# Patient Record
Sex: Male | Born: 1960 | Race: Black or African American | Hispanic: No | Marital: Married | Smoking: Never smoker
Health system: Southern US, Community
[De-identification: ages and names within clinical notes are randomized; demographics above are authoritative.]

---

## 2015-03-02 ENCOUNTER — Observation Stay (HOSPITAL_COMMUNITY)
Admission: EM | Admit: 2015-03-02 | Discharge: 2015-03-04 | Disposition: A | Discharge status: Home / Self Care | Payer: BC Managed Care – PPO | Attending: Internal Medicine | Admitting: Internal Medicine

## 2015-03-02 ENCOUNTER — Emergency Department (HOSPITAL_COMMUNITY): Payer: BC Managed Care – PPO

## 2015-03-02 DIAGNOSIS — R569 Unspecified convulsions: Secondary | ICD-10-CM | POA: Diagnosis not present

## 2015-03-02 DIAGNOSIS — R531 Weakness: Secondary | ICD-10-CM | POA: Diagnosis not present

## 2015-03-02 DIAGNOSIS — M542 Cervicalgia: Secondary | ICD-10-CM

## 2015-03-02 DIAGNOSIS — Z87820 Personal history of traumatic brain injury: Secondary | ICD-10-CM | POA: Diagnosis not present

## 2015-03-02 LAB — COMPREHENSIVE METABOLIC PANEL
ALK PHOS: 77 U/L (ref 38–126)
ALT: 23 U/L (ref 17–63)
ANION GAP: 17 — AB (ref 5–15)
AST: 39 U/L (ref 15–41)
Albumin: 3.6 g/dL (ref 3.5–5.0)
BILIRUBIN TOTAL: 0.8 mg/dL (ref 0.3–1.2)
BUN: 14 mg/dL (ref 6–20)
CHLORIDE: 106 mmol/L (ref 101–111)
CO2: 17 mmol/L — ABNORMAL LOW (ref 22–32)
Calcium: 8.9 mg/dL (ref 8.9–10.3)
Creatinine, Ser: 1.61 mg/dL — ABNORMAL HIGH (ref 0.61–1.24)
GFR calc non Af Amer: 46 mL/min — ABNORMAL LOW (ref >60)
GFR, EST AFRICAN AMERICAN: 54 mL/min — AB (ref >60)
Glucose, Bld: 158 mg/dL — ABNORMAL HIGH (ref 65–99)
Potassium: 4.8 mmol/L (ref 3.5–5.1)
SODIUM: 140 mmol/L (ref 135–145)
TOTAL PROTEIN: 6.5 g/dL (ref 6.5–8.1)

## 2015-03-02 LAB — CBC
HCT: 41.7 % (ref 39.0–52.0)
HEMOGLOBIN: 13.7 g/dL (ref 13.0–17.0)
MCH: 26.6 pg (ref 26.0–34.0)
MCHC: 32.9 g/dL (ref 30.0–36.0)
MCV: 80.8 fL (ref 78.0–100.0)
Platelets: 204 10*3/uL (ref 150–400)
RBC: 5.16 MIL/uL (ref 4.22–5.81)
RDW: 13.6 % (ref 11.5–15.5)
WBC: 5.1 10*3/uL (ref 4.0–10.5)

## 2015-03-02 LAB — RAPID URINE DRUG SCREEN, HOSP PERFORMED
AMPHETAMINES: NOT DETECTED
BENZODIAZEPINES: POSITIVE — AB
Barbiturates: NOT DETECTED
COCAINE: NOT DETECTED
OPIATES: NOT DETECTED
Tetrahydrocannabinol: NOT DETECTED

## 2015-03-02 LAB — MAGNESIUM: Magnesium: 2.3 mg/dL (ref 1.7–2.4)

## 2015-03-02 LAB — CBG MONITORING, ED: GLUCOSE-CAPILLARY: 162 mg/dL — AB (ref 65–99)

## 2015-03-02 MED ORDER — LORAZEPAM 2 MG/ML IJ SOLN
1.0000 mg | Freq: Once | INTRAMUSCULAR | Status: DC
  Filled 2015-03-02: qty 1

## 2015-03-02 MED ORDER — GADOBENATE DIMEGLUMINE 529 MG/ML IV SOLN
20.0000 mL | Freq: Once | INTRAVENOUS | Status: AC | PRN
  Administered 2015-03-02: 18 mL via INTRAVENOUS
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
## 2015-03-02 NOTE — ED Notes (Signed)
Pt still mri

## 2015-03-02 NOTE — ED Notes (Signed)
No complaints  alert

## 2015-03-02 NOTE — ED Notes (Signed)
Pt in from friend's home while visiting, per [facility] EMS pt had witnessed unresponsive episode lasting 1 min with L gaze prior to episode, pt noted to have bil extremity shaking during episode, pt rcvd 5 mg IM Versed pta, pt reported to [facility] @ 17:20, pt reported to have tongue injury present, neg for urine incontinence, pt reported to have size 3 pupils non reactive, pt airway intact upon arrival, pt non verbal since episode, pt noted to be combative

## 2015-03-02 NOTE — Consult Note (Signed)
Admission H&P    Chief Complaint: Witnessed generalized seizure.  HPI: Dustin Jarvis is an 55 y.o. male was brought to the emergency room following a witnessed generalized seizure at home. Patient has no history of seizure activity. He was postictal and combative afterwards. Mental status subsequently returned to normal after arriving in the emergency room. He and his spouse are not aware of any recent illnesses. CT scan of his head showed no acute intracranial abnormality. Glucose was 168. Electrolytes were normal, including sodium, calcium and magnesium. Urine drug screen results are pending. There is no history of alcohol abuse.  Past medical history: Scoliosis Neck pain  No past surgical history on file.  Family history: Obtained and was noncontributory.  Social History:  has no tobacco, alcohol, and drug history on file.  Allergies: No Known Allergies  Medications: Patient's preadmission medications were reviewed by me.  ROS: History obtained from spouse and the patient  General ROS: negative for - chills, fatigue, fever, night sweats, weight gain or weight loss Psychological ROS: negative for - behavioral disorder, hallucinations, memory difficulties, mood swings or suicidal ideation Ophthalmic ROS: negative for - blurry vision, double vision, eye pain or loss of vision ENT ROS: negative for - epistaxis, nasal discharge, oral lesions, sore throat, tinnitus or vertigo Allergy and Immunology ROS: negative for - hives or itchy/watery eyes Hematological and Lymphatic ROS: negative for - bleeding problems, bruising or swollen lymph nodes Endocrine ROS: negative for - galactorrhea, hair pattern changes, polydipsia/polyuria or temperature intolerance Respiratory ROS: negative for - cough, hemoptysis, shortness of breath or wheezing Cardiovascular ROS: negative for - chest pain, dyspnea on exertion, edema or irregular heartbeat Gastrointestinal ROS: negative for - abdominal pain,  diarrhea, hematemesis, nausea/vomiting or stool incontinence Genito-Urinary ROS: negative for - dysuria, hematuria, incontinence or urinary frequency/urgency Musculoskeletal ROS: negative for - joint swelling or muscular weakness Neurological ROS: as noted in HPI Dermatological ROS: negative for rash and skin lesion changes  Physical Examination: Blood pressure 123/77, pulse 90, resp. rate 16, SpO2 100 %.  HEENT-  Normocephalic, no lesions, without obvious abnormality.  Normal external eye and conjunctiva.  Normal TM's bilaterally.  Normal auditory canals and external ears. Normal external nose, mucus membranes and septum.  Normal pharynx. Neck supple with no masses, nodes, nodules or enlargement. Cardiovascular - regular rate and rhythm, S1, S2 normal, no murmur, click, rub or gallop Lungs - chest clear, no wheezing, rales, normal symmetric air entry Abdomen - soft, non-tender; bowel sounds normal; no masses,  no organomegaly Extremities - no joint deformities, effusion, or inflammation and no edema  Neurologic Examination: Mental Status: Alert, oriented, thought content appropriate.  Speech fluent without evidence of aphasia. Able to follow commands without difficulty. Cranial Nerves: II-Visual fields were normal. III/IV/VI-Pupils were equal and reacted normally to light. Extraocular movements were full and conjugate.    V/VII-no facial numbness and no facial weakness. VIII-normal. X-normal speech and symmetrical palatal movement. XI: trapezius strength/neck flexion strength normal bilaterally XII-midline tongue extension with normal strength. Motor: 5/5 bilaterally with normal tone and bulk Sensory: Normal throughout. Deep Tendon Reflexes: 2+ and symmetric, except absent in right lower extremity. Plantars: Flexor bilaterally Cerebellar: Normal finger-to-nose testing. Carotid auscultation: Normal  Results for orders placed or performed during the hospital encounter of 03/02/15  (from the past 48 hour(s))  CBG monitoring, ED     Status: Abnormal   Collection Time: 03/02/15  5:45 PM  Result Value Ref Range   Glucose-Capillary 162 (H) 65 - 99 mg/dL  Comment 1 Notify RN    Comment 2 Document in Chart   CBC - if new onset seizures     Status: None   Collection Time: 03/02/15  5:50 PM  Result Value Ref Range   WBC 5.1 4.0 - 10.5 K/uL   RBC 5.16 4.22 - 5.81 MIL/uL   Hemoglobin 13.7 13.0 - 17.0 g/dL   HCT 41.7 39.0 - 52.0 %   MCV 80.8 78.0 - 100.0 fL   MCH 26.6 26.0 - 34.0 pg   MCHC 32.9 30.0 - 36.0 g/dL   RDW 13.6 11.5 - 15.5 %   Platelets 204 150 - 400 K/uL  Comprehensive metabolic panel     Status: Abnormal   Collection Time: 03/02/15  5:50 PM  Result Value Ref Range   Sodium 140 135 - 145 mmol/L   Potassium 4.8 3.5 - 5.1 mmol/L   Chloride 106 101 - 111 mmol/L   CO2 17 (L) 22 - 32 mmol/L   Glucose, Bld 158 (H) 65 - 99 mg/dL   BUN 14 6 - 20 mg/dL   Creatinine, Ser 1.61 (H) 0.61 - 1.24 mg/dL   Calcium 8.9 8.9 - 10.3 mg/dL   Total Protein 6.5 6.5 - 8.1 g/dL   Albumin 3.6 3.5 - 5.0 g/dL   AST 39 15 - 41 U/L   ALT 23 17 - 63 U/L   Alkaline Phosphatase 77 38 - 126 U/L   Total Bilirubin 0.8 0.3 - 1.2 mg/dL   GFR calc non Af Amer 46 (L) >60 mL/min   GFR calc Af Amer 54 (L) >60 mL/min    Comment: (NOTE) The eGFR has been calculated using the CKD EPI equation. This calculation has not been validated in all clinical situations. eGFR's persistently <60 mL/min signify possible Chronic Kidney Disease.    Anion gap 17 (H) 5 - 15  Magnesium     Status: None   Collection Time: 03/02/15  5:50 PM  Result Value Ref Range   Magnesium 2.3 1.7 - 2.4 mg/dL   Ct Head Wo Contrast  03/02/2015  CLINICAL DATA:  New on set seizure this afternoon EXAM: CT HEAD WITHOUT CONTRAST TECHNIQUE: Contiguous axial images were obtained from the base of the skull through the vertex without intravenous contrast. COMPARISON:  None. FINDINGS: There is no intra or extra-axial fluid  collection or mass lesion. The basilar cisterns and ventricles have a normal appearance. There is no CT evidence for acute infarction or hemorrhage. No calvarial injury. Mild mucoperiosteal thickening within the left maxillary sinus. IMPRESSION: 1.  No evidence for acute intracranial abnormality. 2. Mild chronic sinusitis. Electronically Signed   By: Nolon Nations M.D.   On: 03/02/2015 17:57    Assessment/Plan 55 year old man presenting following a generalized tonic-clonic seizure, with no history of any seizure activity. Clinical examination and CT scan of his head was unremarkable. Laboratory studies were unremarkable. Urine drug screen is pending, however.  Recommendations: 1. MRI of the brain without and with contrast 2. EEG, routine adult study 3. No anticonvulsant medication indicated at this point  We will continue to follow this patient with you  C.R. Nicole Kindred, [place] [facility] [phone]  03/02/2015, 8:48 PM

## 2015-03-02 NOTE — ED Notes (Signed)
Patient undressed, in gown, on monitor, continuous pulse oximetry, blood pressure cuff and oxygen [place] (2L) 

## 2015-03-02 NOTE — ED Notes (Signed)
The pt has no pain  He is c/o being very hungry

## 2015-03-02 NOTE — ED Notes (Signed)
Pt to mri 

## 2015-03-02 NOTE — ED Provider Notes (Signed)
CSN: [id number]     Arrival date & time 03/02/15  1712 History   First MD Initiated Contact with Patient 03/02/15 1725     Chief Complaint  Patient presents with  . Seizures     (Consider location/radiation/quality/duration/timing/severity/associated sxs/prior Treatment) HPI   9 y m w no sig PMH who presents with a first time witnessed seizure.  The episode lasted for 1 minute, patient had L sided gaze and had tonic/clonic movements of bilateral upper extremities..  Patient was given 5mg  versed IM with EMS.    Patient is still confused at this time and unable to offer hx  No past medical history on file. No past surgical history on file. No family history on file. Social History  Substance Use Topics  . Smoking status: Not on file  . Smokeless tobacco: Not on file  . Alcohol Use: Not on file    Review of Systems  Unable to perform ROS: Mental status change  Neurological: Positive for seizures.      Allergies  Review of patient's allergies indicates no known allergies.  Home Medications   Prior to Admission medications   Medication Sig Start Date End Date Taking? Authorizing Provider  cyclobenzaprine (FLEXERIL) 5 MG tablet Take 1.25 mg by mouth daily as needed (back pain). 1/4 tablet   Yes Historical Provider, MD  ibuprofen (ADVIL,MOTRIN) 200 MG tablet Take 200 mg by mouth daily as needed (pain).   Yes Historical Provider, MD  naproxen sodium (ALEVE) 220 MG tablet Take 220 mg by mouth daily as needed (pain).   Yes Historical Provider, MD  OVER THE COUNTER MEDICATION Take 1 tablet by mouth daily as needed (seasonal allergies). Over the counter allergy tablet   Yes Historical Provider, MD  tadalafil (CIALIS) 5 MG tablet Take 5 mg by mouth daily as needed for erectile dysfunction.   Yes Historical Provider, MD   BP 142/83 mmHg  Pulse 73  Temp(Src) 98.4 F (36.9 C) (Oral)  Resp 18  SpO2 97% Physical Exam  Constitutional: No distress.  HENT:  Head: Normocephalic and  atraumatic.  Eyes: EOM are normal. Pupils are equal, round, and reactive to light.  Neck: Normal range of motion. Neck supple.  Cardiovascular: Normal rate.   Pulmonary/Chest: Effort normal. No respiratory distress.  Abdominal: Soft. There is no tenderness.  Musculoskeletal: He exhibits no edema.  Neurological:  Pupils 3mm bl and reactive to light.  Patient localizing pain in bilateral upper extremities.  Skin: No rash noted. He is not diaphoretic.    ED Course  Procedures (including critical care time) Labs Review Labs Reviewed  COMPREHENSIVE METABOLIC PANEL - Abnormal; Notable for the following:    CO2 17 (*)    Glucose, Bld 158 (*)    Creatinine, Ser 1.61 (*)    GFR calc non Af Amer 46 (*)    GFR calc Af Amer 54 (*)    Anion gap 17 (*)    All other components within normal limits  URINE RAPID DRUG SCREEN, HOSP PERFORMED - Abnormal; Notable for the following:    Benzodiazepines POSITIVE (*)    All other components within normal limits  CBG MONITORING, ED - Abnormal; Notable for the following:    Glucose-Capillary 162 (*)    All other components within normal limits  CBC  MAGNESIUM  CBG MONITORING, ED  CBG MONITORING, ED    Imaging Review Ct Head Wo Contrast  03/02/2015  CLINICAL DATA:  New on set seizure this afternoon EXAM: CT HEAD WITHOUT  CONTRAST TECHNIQUE: Contiguous axial images were obtained from the base of the skull through the vertex without intravenous contrast. COMPARISON:  None. FINDINGS: There is no intra or extra-axial fluid collection or mass lesion. The basilar cisterns and ventricles have a normal appearance. There is no CT evidence for acute infarction or hemorrhage. No calvarial injury. Mild mucoperiosteal thickening within the left maxillary sinus. IMPRESSION: 1.  No evidence for acute intracranial abnormality. 2. Mild chronic sinusitis. Electronically Signed   By: Norva Pavlov M.D.   On: 03/02/2015 17:57   Mr Laqueta Jean JX Contrast  03/03/2015  CLINICAL  DATA:  Witnessed general seizure at home today. No history of seizures. EXAM: MRI HEAD WITHOUT AND WITH CONTRAST MRI CERVICAL SPINE WITHOUT AND WITH CONTRAST TECHNIQUE: Multiplanar, multiecho pulse sequences of the brain and surrounding structures, and cervical spine, to include the craniocervical junction and cervicothoracic junction, were obtained without and with intravenous contrast. 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 Cerebellar tonsillars descend 17 mm below the foramen magnum with pointed appearance and effacement of cerebral spinal fluid space. No MR findings of intracranial hypotension. The ventricles and sulci are normal for patient's age. No abnormal parenchymal signal, mass lesions, mass effect. No reduced diffusion to suggest acute ischemia. A few subcentimeter foci of FLAIR T2 hyperintense signal are less than expected for age, most commonly seen with chronic small vessel ischemic disease. No susceptibility artifact to suggest hemorrhage. No abnormal intracranial enhancement. Hippocampi demonstrate symmetric size, morphology and signal. No abnormal extra-axial fluid collections. No extra-axial masses though, contrast enhanced sequences would be more sensitive. Normal major intracranial vascular flow voids seen at the skull base. Ocular globes and orbital contents are unremarkable though not tailored for evaluation. No abnormal sellar expansion. No suspicious calvarial bone marrow signal. Mild dolichocephaly. Small LEFT maxillary mucosal retention cyst without paranasal sinus air-fluid levels. Mastoid air cells are well aerated. MRI CERVICAL SPINE FINDINGS Prominent body habitus results in decreased signal to noise ratio. Cervical vertebral bodies are intact and aligned and maintenance of cervical lordosis. Intervertebral discs demonstrate normal  morphology and signal characteristics. No STIR signal abnormality to suggest acute osseous process. No abnormal osseous or intradiscal enhancement. Prominent central spinal canal is less than 2 mm. No abnormal cord signal or myelomalacia. No abnormal cord, leptomeningeal or epidural enhancement. Included prevertebral and paraspinal soft tissues are normal. Level by level evaluation: C2-3: No disc bulge, canal stenosis nor neural foraminal narrowing. C3-4: Small broad-based disc bulge, uncovertebral hypertrophy. No canal stenosis. Mild RIGHT, moderate LEFT neural foraminal narrowing. C4-5: No disc bulge. Mild facet arthropathy and uncovertebral hypertrophy without canal stenosis. Mild to moderate RIGHT neural foraminal narrowing. C5-6: No disc bulge. Moderate RIGHT, mild LEFT facet arthropathy and uncovertebral hypertrophy without canal stenosis. Mild bilateral neural foraminal narrowing. C6-7 and C7-T1: No disc bulge, canal stenosis or neural foraminal narrowing. IMPRESSION: MRI BRAIN:  No acute intracranial process. Platybasia and Chiari 1 malformation. MRI CERVICAL SPINE:  Prominent central spinal canal, without syrinx. Degenerative change of the cervical spine without canal stenosis. C3-4 through C5-6 neural foraminal narrowing: Moderate on the LEFT at C3-4. Electronically Signed   By: Awilda Metro M.D.   On: 03/03/2015 00:16   Mr Cervical Spine W Wo Contrast  03/03/2015  CLINICAL DATA:  Witnessed general seizure at home today. No history of seizures. EXAM: MRI HEAD WITHOUT AND WITH CONTRAST MRI CERVICAL SPINE  WITHOUT AND WITH CONTRAST TECHNIQUE: Multiplanar, multiecho pulse sequences of the brain and surrounding structures, and cervical spine, to include the craniocervical junction and cervicothoracic junction, were obtained without and with intravenous contrast. CONTRAST:  18mL MULTIHANCE GADOBENATE DIMEGLUMINE 529 MG/ML IV SOLN COMPARISON:  CT head March 02, 2015 at 1726 hours FINDINGS: MRI HEAD  FINDINGS Platybasia without basilar invagination. Cerebellar tonsillars descend 17 mm below the foramen magnum with pointed appearance and effacement of cerebral spinal fluid space. No MR findings of intracranial hypotension. The ventricles and sulci are normal for patient's age. No abnormal parenchymal signal, mass lesions, mass effect. No reduced diffusion to suggest acute ischemia. A few subcentimeter foci of FLAIR T2 hyperintense signal are less than expected for age, most commonly seen with chronic small vessel ischemic disease. No susceptibility artifact to suggest hemorrhage. No abnormal intracranial enhancement. Hippocampi demonstrate symmetric size, morphology and signal. No abnormal extra-axial fluid collections. No extra-axial masses though, contrast enhanced sequences would be more sensitive. Normal major intracranial vascular flow voids seen at the skull base. Ocular globes and orbital contents are unremarkable though not tailored for evaluation. No abnormal sellar expansion. No suspicious calvarial bone marrow signal. Mild dolichocephaly. Small LEFT maxillary mucosal retention cyst without paranasal sinus air-fluid levels. Mastoid air cells are well aerated. MRI CERVICAL SPINE FINDINGS Prominent body habitus results in decreased signal to noise ratio. Cervical vertebral bodies are intact and aligned and maintenance of cervical lordosis. Intervertebral discs demonstrate normal morphology and signal characteristics. No STIR signal abnormality to suggest acute osseous process. No abnormal osseous or intradiscal enhancement. Prominent central spinal canal is less than 2 mm. No abnormal cord signal or myelomalacia. No abnormal cord, leptomeningeal or epidural enhancement. Included prevertebral and paraspinal soft tissues are normal. Level by level evaluation: C2-3: No disc bulge, canal stenosis nor neural foraminal narrowing. C3-4: Small broad-based disc bulge, uncovertebral hypertrophy. No canal stenosis.  Mild RIGHT, moderate LEFT neural foraminal narrowing. C4-5: No disc bulge. Mild facet arthropathy and uncovertebral hypertrophy without canal stenosis. Mild to moderate RIGHT neural foraminal narrowing. C5-6: No disc bulge. Moderate RIGHT, mild LEFT facet arthropathy and uncovertebral hypertrophy without canal stenosis. Mild bilateral neural foraminal narrowing. C6-7 and C7-T1: No disc bulge, canal stenosis or neural foraminal narrowing. IMPRESSION: MRI BRAIN:  No acute intracranial process. Platybasia and Chiari 1 malformation. MRI CERVICAL SPINE:  Prominent central spinal canal, without syrinx. Degenerative change of the cervical spine without canal stenosis. C3-4 through C5-6 neural foraminal narrowing: Moderate on the LEFT at C3-4. Electronically Signed   By: Awilda Metro M.D.   On: 03/03/2015 00:16   I have personally reviewed and evaluated these images and lab results as part of my medical decision-making.   EKG Interpretation   Date/Time:  Saturday March 02 2015 17:18:58 EST Ventricular Rate:  116 PR Interval:  158 QRS Duration: 91 QT Interval:  334 QTC Calculation: 464 R Axis:   79 Text Interpretation:  Sinus tachycardia Confirmed by ZAVITZ  MD, JOSHUA  ([id number]) on 03/02/2015 5:28:40 PM      MDM   Final diagnoses:  Seizure (HCC)   64 y m w no sig PMH who presents with a first time witnessed seizure.    Exam as above.  Pt seems post-ictal at this time but is protecting his airway and localizing pain, will monitor closely  Will obtain ct head, cbc/cmp/mag/uds/cbg  cbg good.  ekg w/ sinus tachy Cbc/bmp/uds unremarkable. Ct head w/o bleed.  Pt has come back to baseline, normal neuro exam. Neuro  consult  Neuro recs admission w/ mr brain and eeg.  Will admit to medicine    Silas Flood, MD 03/03/15 0200  Blane Ohara, MD 03/03/15 [phone]

## 2015-03-02 NOTE — ED Notes (Signed)
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  Per hospital policy blood was drawn for exposure panel. 

## 2015-03-02 NOTE — ED Notes (Signed)
2 mg Ativan wasted & witnessed by Cher Nakai & Thayer Ohm Chrisco, Rn, pt did not receive medication, meds wasted in Pyxis upon pulling the medication-unable to return the meds to [facility]

## 2015-03-02 NOTE — ED Notes (Signed)
Resident back at the bedside.  

## 2015-03-02 NOTE — ED Notes (Signed)
POCT CBG resulted 162; Toniann Fail, RN aware

## 2015-03-03 ENCOUNTER — Encounter (HOSPITAL_COMMUNITY): Payer: Self-pay | Admitting: *Deleted

## 2015-03-03 DIAGNOSIS — R569 Unspecified convulsions: Secondary | ICD-10-CM | POA: Diagnosis not present

## 2015-03-03 LAB — BASIC METABOLIC PANEL
ANION GAP: 11 (ref 5–15)
BUN: 11 mg/dL (ref 6–20)
CALCIUM: 8.9 mg/dL (ref 8.9–10.3)
CHLORIDE: 105 mmol/L (ref 101–111)
CO2: 22 mmol/L (ref 22–32)
Creatinine, Ser: 1.23 mg/dL (ref 0.61–1.24)
GFR calc non Af Amer: >60 mL/min (ref >60)
GLUCOSE: 146 mg/dL — AB (ref 65–99)
POTASSIUM: 4.4 mmol/L (ref 3.5–5.1)
Sodium: 138 mmol/L (ref 135–145)

## 2015-03-03 LAB — CBC
HEMATOCRIT: 39.4 % (ref 39.0–52.0)
HEMOGLOBIN: 13.2 g/dL (ref 13.0–17.0)
MCH: 26.6 pg (ref 26.0–34.0)
MCHC: 33.5 g/dL (ref 30.0–36.0)
MCV: 79.3 fL (ref 78.0–100.0)
Platelets: 176 10*3/uL (ref 150–400)
RBC: 4.97 MIL/uL (ref 4.22–5.81)
RDW: 14.1 % (ref 11.5–15.5)
WBC: 6.5 10*3/uL (ref 4.0–10.5)

## 2015-03-03 MED ORDER — OXYCODONE HCL 5 MG PO TABS: 5.0000 mg | ORAL_TABLET | ORAL | Status: DC | PRN

## 2015-03-03 MED ORDER — SODIUM CHLORIDE 0.9 % IV SOLN
INTRAVENOUS | Status: DC
  Administered 2015-03-03: 03:00:00 via INTRAVENOUS

## 2015-03-03 MED ORDER — LORAZEPAM 2 MG/ML IJ SOLN
1.0000 mg | INTRAMUSCULAR | Status: DC | PRN
Start: 2015-03-03 — End: 2015-03-04

## 2015-03-03 MED ORDER — ONDANSETRON HCL 4 MG PO TABS: 4.0000 mg | ORAL_TABLET | Freq: Four times a day (QID) | ORAL | Status: DC | PRN

## 2015-03-03 MED ORDER — ACETAMINOPHEN 650 MG RE SUPP
650.0000 mg | Freq: Four times a day (QID) | RECTAL | Status: DC | PRN
Start: 2015-03-03 — End: 2015-03-04

## 2015-03-03 MED ORDER — ALUM & MAG HYDROXIDE-SIMETH 200-200-20 MG/5ML PO SUSP: 30.0000 mL | Freq: Four times a day (QID) | ORAL | Status: DC | PRN

## 2015-03-03 MED ORDER — ACETAMINOPHEN 325 MG PO TABS: 650.0000 mg | ORAL_TABLET | Freq: Four times a day (QID) | ORAL | Status: DC | PRN

## 2015-03-03 MED ORDER — ONDANSETRON HCL 4 MG/2ML IJ SOLN: 4.0000 mg | Freq: Four times a day (QID) | INTRAMUSCULAR | Status: DC | PRN

## 2015-03-03 MED ORDER — HYDROMORPHONE HCL 1 MG/ML IJ SOLN: 0.5000 mg | INTRAMUSCULAR | Status: DC | PRN

## 2015-03-03 NOTE — ED Notes (Signed)
Dr Lovell Sheehan just finished seeing the pt

## 2015-03-03 NOTE — ED Notes (Signed)
Unable to give report

## 2015-03-03 NOTE — ED Notes (Signed)
The pt passed his swallow screen when he returned from [facility]

## 2015-03-03 NOTE — ED Notes (Signed)
Report called to rn on 5 m 

## 2015-03-03 NOTE — Progress Notes (Signed)
Subjective: No further seizures  Exam: Filed Vitals:   03/03/15 1327 03/03/15 1723  BP: 134/69 131/86  Pulse: 80 76  Temp: 98.6 F (37 C) 98.6 F (37 C)  Resp: 18 18   Gen: In bed, NAD  Neuro: He is awake and alert, cooperative. Interactive and appropriate.   Pertinent Labs: Mildly elevated glucose  Impression: 55 year old male with new onset seizure of unclear etiology. With first-time seizure it is not necessarily imperative to start antiepileptic medications. I would favor getting an EEG and this is being awaited.  Recommendations: 1) EEG 2) he is aware that he is not supposed to drive for 6 months.  Ritta Slot, MD [facility] [phone]  If 7pm- 7am, please page neurology on call as listed in [facility]ION.

## 2015-03-03 NOTE — ED Notes (Signed)
Attempted report no nurse has been assigned  Unable to give

## 2015-03-03 NOTE — Progress Notes (Signed)
[facility] PROGRESS NOTE  Dustin Jarvis ZOX:[id number] DOB: 01-29-1960 DOA: 03/02/2015 PCP: No primary care provider on file.  Assessment/Plan: 1. Seizure activity -Dustin Jarvis presents after having a seizure at home. He does not have a history of seizures.  -Workup has included on MRI of brain that was negative for intracranial pathology. Lab work reviewed he does not have electrolyte abnormalities, renal failure, elevated white count or any indication of underlying infectious process namely CNS infection. -Neurology not recommending anticonvulsant therapy at this time since this represents first seizure. -I spoke to Dustin Jarvis about not driving for 6 months -Awaiting EEG -Anticipate discharge in the next 24 hours once workup is complete  Code Status: Full code Family Communication: I spoke to multiple family members at bedside Disposition Plan:    Consultants:  Neurology  HPI/Subjective: Dustin Jarvis is a pleasant 55 year old gentleman admitted to medicine service on on 03/02/2015 when he presented with a seizure. He was in his usual state health, found by family members having tonic-clonic movements, (shaking all over) foaming at mouth, unresponsive with evidence of tongue biting. 5 members calling EMS where he was administered Versed. In the emergency room found to be postictal. Patient was seen and evaluated by Dr. Roseanne Reno of neurology. Workup included an MRI of the brain which did not reveal acute intracranial pathology.   Objective: Filed Vitals:   03/03/15 0500 03/03/15 0948  BP: 124/78 130/81  Pulse: 65 72  Temp: 98.4 F (36.9 C) 98.7 F (37.1 C)  Resp: 18 20   No intake or output data in the 24 hours ending 03/03/15 1321 Filed Weights   03/03/15 0355  Weight: 90.719 kg (200 lb)    Exam:   General:  Patient is nontoxic appearing, no acute distress, awake and alert  Cardiovascular: Regular rate and rhythm normal S1-S2 no extremity edema  Respiratory: Normal  respiratory effort, lungs are clear to auscultation bilaterally  Abdomen: Soft nontender nondistended positive bowel sounds  Musculoskeletal: No edema  Neurological: Nonfocal  Data Reviewed: Basic Metabolic Panel:  Recent Labs Lab 03/02/15 1750 03/03/15 0258  NA 140 138  K 4.8 4.4  CL 106 105  CO2 17* 22  GLUCOSE 158* 146*  BUN 14 11  CREATININE 1.61* 1.23  CALCIUM 8.9 8.9  MG 2.3  --    Liver Function Tests:  Recent Labs Lab 03/02/15 1750  AST 39  ALT 23  ALKPHOS 77  BILITOT 0.8  PROT 6.5  ALBUMIN 3.6   No results for input(s): LIPASE, AMYLASE in the last 168 hours. No results for input(s): AMMONIA in the last 168 hours. CBC:  Recent Labs Lab 03/02/15 1750 03/03/15 0258  WBC 5.1 6.5  HGB 13.7 13.2  HCT 41.7 39.4  MCV 80.8 79.3  PLT 204 176   Cardiac Enzymes: No results for input(s): CKTOTAL, CKMB, CKMBINDEX, TROPONINI in the last 168 hours. BNP (last 3 results) No results for input(s): BNP in the last 8760 hours.  ProBNP (last 3 results) No results for input(s): PROBNP in the last 8760 hours.  CBG:  Recent Labs Lab 03/02/15 1745  GLUCAP 162*    No results found for this or any previous visit (from the past 240 hour(s)).   Studies: Ct Head Wo Contrast  03/02/2015  CLINICAL DATA:  New on set seizure this afternoon EXAM: CT HEAD WITHOUT CONTRAST TECHNIQUE: Contiguous axial images were obtained from the base of the skull through the vertex without intravenous contrast. COMPARISON:  None. FINDINGS: There is no intra  or extra-axial fluid collection or mass lesion. The basilar cisterns and ventricles have a normal appearance. There is no CT evidence for acute infarction or hemorrhage. No calvarial injury. Mild mucoperiosteal thickening within the left maxillary sinus. IMPRESSION: 1.  No evidence for acute intracranial abnormality. 2. Mild chronic sinusitis. Electronically Signed   By: Norva Pavlov M.D.   On: 03/02/2015 17:57   Dustin Jarvis GL  Contrast  03/03/2015  CLINICAL DATA:  Witnessed general seizure at home today. No history of seizures. EXAM: MRI HEAD WITHOUT AND WITH CONTRAST MRI CERVICAL SPINE WITHOUT AND WITH CONTRAST TECHNIQUE: Multiplanar, multiecho pulse sequences of the brain and surrounding structures, and cervical spine, to include the craniocervical junction and cervicothoracic junction, were obtained without and with intravenous contrast. 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 Cerebellar tonsillars descend 17 mm below the foramen magnum with pointed appearance and effacement of cerebral spinal fluid space. No Dustin findings of intracranial hypotension. The ventricles and sulci are normal for patient's age. No abnormal parenchymal signal, mass lesions, mass effect. No reduced diffusion to suggest acute ischemia. A few subcentimeter foci of FLAIR T2 hyperintense signal are less than expected for age, most commonly seen with chronic small vessel ischemic disease. No susceptibility artifact to suggest hemorrhage. No abnormal intracranial enhancement. Hippocampi demonstrate symmetric size, morphology and signal. No abnormal extra-axial fluid collections. No extra-axial masses though, contrast enhanced sequences would be more sensitive. Normal major intracranial vascular flow voids seen at the skull base. Ocular globes and orbital contents are unremarkable though not tailored for evaluation. No abnormal sellar expansion. No suspicious calvarial bone marrow signal. Mild dolichocephaly. Small LEFT maxillary mucosal retention cyst without paranasal sinus air-fluid levels. Mastoid air cells are well aerated. MRI CERVICAL SPINE FINDINGS Prominent body habitus results in decreased signal to noise ratio. Cervical vertebral bodies are intact and aligned and maintenance of cervical lordosis. Intervertebral  discs demonstrate normal morphology and signal characteristics. No STIR signal abnormality to suggest acute osseous process. No abnormal osseous or intradiscal enhancement. Prominent central spinal canal is less than 2 mm. No abnormal cord signal or myelomalacia. No abnormal cord, leptomeningeal or epidural enhancement. Included prevertebral and paraspinal soft tissues are normal. Level by level evaluation: C2-3: No disc bulge, canal stenosis nor neural foraminal narrowing. C3-4: Small broad-based disc bulge, uncovertebral hypertrophy. No canal stenosis. Mild RIGHT, moderate LEFT neural foraminal narrowing. C4-5: No disc bulge. Mild facet arthropathy and uncovertebral hypertrophy without canal stenosis. Mild to moderate RIGHT neural foraminal narrowing. C5-6: No disc bulge. Moderate RIGHT, mild LEFT facet arthropathy and uncovertebral hypertrophy without canal stenosis. Mild bilateral neural foraminal narrowing. C6-7 and C7-T1: No disc bulge, canal stenosis or neural foraminal narrowing. IMPRESSION: MRI BRAIN:  No acute intracranial process. Platybasia and Chiari 1 malformation. MRI CERVICAL SPINE:  Prominent central spinal canal, without syrinx. Degenerative change of the cervical spine without canal stenosis. C3-4 through C5-6 neural foraminal narrowing: Moderate on the LEFT at C3-4. Electronically Signed   By: Awilda Metro M.D.   On: 03/03/2015 00:16   Dustin Cervical Spine W Wo Contrast  03/03/2015  CLINICAL DATA:  Witnessed general seizure at home today. No history of seizures. EXAM: MRI HEAD WITHOUT AND WITH CONTRAST MRI CERVICAL SPINE WITHOUT AND WITH CONTRAST TECHNIQUE: Multiplanar, multiecho pulse sequences of the brain and surrounding structures, and cervical spine, to include the craniocervical junction and cervicothoracic junction, were  obtained without and with intravenous contrast. CONTRAST:  18mL MULTIHANCE GADOBENATE DIMEGLUMINE 529 MG/ML IV SOLN COMPARISON:  CT head March 02, 2015 at 1726 hours  FINDINGS: MRI HEAD FINDINGS Platybasia without basilar invagination. Cerebellar tonsillars descend 17 mm below the foramen magnum with pointed appearance and effacement of cerebral spinal fluid space. No Dustin findings of intracranial hypotension. The ventricles and sulci are normal for patient's age. No abnormal parenchymal signal, mass lesions, mass effect. No reduced diffusion to suggest acute ischemia. A few subcentimeter foci of FLAIR T2 hyperintense signal are less than expected for age, most commonly seen with chronic small vessel ischemic disease. No susceptibility artifact to suggest hemorrhage. No abnormal intracranial enhancement. Hippocampi demonstrate symmetric size, morphology and signal. No abnormal extra-axial fluid collections. No extra-axial masses though, contrast enhanced sequences would be more sensitive. Normal major intracranial vascular flow voids seen at the skull base. Ocular globes and orbital contents are unremarkable though not tailored for evaluation. No abnormal sellar expansion. No suspicious calvarial bone marrow signal. Mild dolichocephaly. Small LEFT maxillary mucosal retention cyst without paranasal sinus air-fluid levels. Mastoid air cells are well aerated. MRI CERVICAL SPINE FINDINGS Prominent body habitus results in decreased signal to noise ratio. Cervical vertebral bodies are intact and aligned and maintenance of cervical lordosis. Intervertebral discs demonstrate normal morphology and signal characteristics. No STIR signal abnormality to suggest acute osseous process. No abnormal osseous or intradiscal enhancement. Prominent central spinal canal is less than 2 mm. No abnormal cord signal or myelomalacia. No abnormal cord, leptomeningeal or epidural enhancement. Included prevertebral and paraspinal soft tissues are normal. Level by level evaluation: C2-3: No disc bulge, canal stenosis nor neural foraminal narrowing. C3-4: Small broad-based disc bulge, uncovertebral hypertrophy.  No canal stenosis. Mild RIGHT, moderate LEFT neural foraminal narrowing. C4-5: No disc bulge. Mild facet arthropathy and uncovertebral hypertrophy without canal stenosis. Mild to moderate RIGHT neural foraminal narrowing. C5-6: No disc bulge. Moderate RIGHT, mild LEFT facet arthropathy and uncovertebral hypertrophy without canal stenosis. Mild bilateral neural foraminal narrowing. C6-7 and C7-T1: No disc bulge, canal stenosis or neural foraminal narrowing. IMPRESSION: MRI BRAIN:  No acute intracranial process. Platybasia and Chiari 1 malformation. MRI CERVICAL SPINE:  Prominent central spinal canal, without syrinx. Degenerative change of the cervical spine without canal stenosis. C3-4 through C5-6 neural foraminal narrowing: Moderate on the LEFT at C3-4. Electronically Signed   By: Awilda Metro M.D.   On: 03/03/2015 00:16    Scheduled Meds: . LORazepam  1 mg Intravenous Once   Continuous Infusions: . sodium chloride 75 mL/hr at 03/03/15 0316    Active Problems:   Seizure (HCC)    Time spent: 25 min    Jeralyn Bennett  [facility] Pager [phone]. If 7PM-7AM, please contact night-coverage at [email or web address], password [facility] 03/03/2015, 1:21 PM

## 2015-03-04 ENCOUNTER — Observation Stay (HOSPITAL_BASED_OUTPATIENT_CLINIC_OR_DEPARTMENT_OTHER)
Admit: 2015-03-04 | Discharge: 2015-03-04 | Disposition: A | Discharge status: Home / Self Care | Payer: BC Managed Care – PPO | Attending: Internal Medicine | Admitting: Internal Medicine

## 2015-03-04 ENCOUNTER — Encounter (HOSPITAL_COMMUNITY): Payer: Self-pay | Admitting: *Deleted

## 2015-03-04 DIAGNOSIS — R569 Unspecified convulsions: Secondary | ICD-10-CM | POA: Diagnosis not present

## 2015-03-04 NOTE — Progress Notes (Signed)
EEG completed, results pending. 

## 2015-03-04 NOTE — Care Management Note (Signed)
Case Management Note  Patient Details  Name: Dustin Jarvis MRN: [id number] Date of Birth: 1961/01/08  Subjective/Objective:                    Action/Plan: Patient presented to the ED for new onset seizure. Will follow for discharge needs pending PT/OT evals and physician orders.  Expected Discharge Date:                  Expected Discharge Plan:     In-House Referral:     Discharge planning Services     Post Acute Care Choice:    Choice offered to:     DME Arranged:    DME Agency:     HH Arranged:    HH Agency:     Status of Service:  In process, will continue to follow  Medicare Important Message Given:    Date Medicare IM Given:    Medicare IM give by:    Date Additional Medicare IM Given:    Additional Medicare Important Message give by:     If discussed at Long Length of Stay Meetings, dates discussed:    Additional CommentsAnda Kraft, RN 03/04/2015, 12:06 PM [phone]

## 2015-03-04 NOTE — Discharge Summary (Signed)
Physician Discharge Summary  Dustin Jarvis ZOX:[id number] DOB: 1960-11-17 DOA: 03/02/2015  PCP: No primary care provider on file.  Admit date: 03/02/2015 Discharge date: 03/04/2015  Time spent: 35 minutes  Recommendations for Outpatient Follow-up:  1. Dustin Jarvis admitted for seizure, workup negative. Was not started on anticonvulsive therapy.  2. He was instructed to not drive for 6 months.    Discharge Diagnoses:  Active Problems:   Seizure [facility])   Discharge Condition: Stable  Diet recommendation: Heart Healthy  Filed Weights   03/03/15 0355  Weight: 90.719 kg (200 lb)    History of present illness:  Dustin Jarvis is a 55 y.o. male who was observed to have seizure activity with loss of consciousness, and shaking all over and foaming at the mouth along with loss of continence this evening. He was also noticed to have blood around his mouth from biting his tongue. His wife called EMS, and when they arrived they administered IV Versed x 1. In the ED, he was evalauted and found to be post-ictal. He reports feeling somewhat dizzy, denies having any chest pain or headache. He denies having any previous Seizures. Neurology was consulted in the ED, and patient was seen by Dr. Roseanne Reno in the ED. An EEG has been ordered for the AM.   Hospital Course:  Dustin. Jarvis is a pleasant 55 year old gentleman admitted to medicine service on on 03/02/2015 when he presented with a seizure. He was in his usual state health, found by family members having tonic-clonic movements, (shaking all over) foaming at mouth, unresponsive with evidence of tongue biting. 5 members calling EMS where he was administered Versed. In the emergency room found to be postictal. Patient was seen and evaluated by Dr. Roseanne Reno of neurology. Workup included an MRI of the brain which did not reveal acute intracranial pathology. Lab work reviewed he does not have electrolyte abnormalities, renal failure, elevated white count or any  indication of underlying infectious process namely CNS infection. An EEG was performed on 03/04/2015 which was interpreted as: "This is a normal EEG for the patients stated age. There were no focal, hemispheric or lateralizing features. No epileptiform activity was recorded."  Given clinical stability he was discharged to his home in stable condition on 03/04/2015.    Consultations:  Neurology  Discharge Exam: Filed Vitals:   03/04/15 0608 03/04/15 0915  BP: 129/81 125/80  Pulse: 60 66  Temp: 97.9 F (36.6 C) 97.7 F (36.5 C)  Resp: 18 18     General: Patient is nontoxic appearing, no acute distress, awake and alert  Cardiovascular: Regular rate and rhythm normal S1-S2 no extremity edema  Respiratory: Normal respiratory effort, lungs are clear to auscultation bilaterally  Abdomen: Soft nontender nondistended positive bowel sounds  Musculoskeletal: No edema  Neurological: Nonfocal  Discharge Instructions   Discharge Instructions    Call MD for:  difficulty breathing, headache or visual disturbances    Complete by:  As directed      Call MD for:  extreme fatigue    Complete by:  As directed      Call MD for:  hives    Complete by:  As directed      Call MD for:  persistant dizziness or light-headedness    Complete by:  As directed      Call MD for:  persistant nausea and vomiting    Complete by:  As directed      Call MD for:  redness, tenderness, or signs of infection (pain, swelling,  redness, odor or green/yellow discharge around incision site)    Complete by:  As directed      Call MD for:  severe uncontrolled pain    Complete by:  As directed      Call MD for:  temperature >100.4    Complete by:  As directed      Call MD for:    Complete by:  As directed      Diet - low sodium heart healthy    Complete by:  As directed      Discharge instructions    Complete by:  As directed   Please follow up with your Family Physician in 1 week     Increase activity  slowly    Complete by:  As directed           Current Discharge Medication List    CONTINUE these medications which have NOT CHANGED   Details  cyclobenzaprine (FLEXERIL) 5 MG tablet Take 1.25 mg by mouth daily as needed (back pain). 1/4 tablet    ibuprofen (ADVIL,MOTRIN) 200 MG tablet Take 200 mg by mouth daily as needed (pain).    OVER THE COUNTER MEDICATION Take 1 tablet by mouth daily as needed (seasonal allergies). Over the counter allergy tablet    tadalafil (CIALIS) 5 MG tablet Take 5 mg by mouth daily as needed for erectile dysfunction.      STOP taking these medications     naproxen sodium (ALEVE) 220 MG tablet        No Known Allergies    The results of significant diagnostics from this hospitalization (including imaging, microbiology, ancillary and laboratory) are listed below for reference.    Significant Diagnostic Studies: Ct Head Wo Contrast  03/02/2015  CLINICAL DATA:  New on set seizure this afternoon EXAM: CT HEAD WITHOUT CONTRAST TECHNIQUE: Contiguous axial images were obtained from the base of the skull through the vertex without intravenous contrast. COMPARISON:  None. FINDINGS: There is no intra or extra-axial fluid collection or mass lesion. The basilar cisterns and ventricles have a normal appearance. There is no CT evidence for acute infarction or hemorrhage. No calvarial injury. Mild mucoperiosteal thickening within the left maxillary sinus. IMPRESSION: 1.  No evidence for acute intracranial abnormality. 2. Mild chronic sinusitis. Electronically Signed   By: Norva Pavlov M.D.   On: 03/02/2015 17:57   Dustin Jarvis ZO Contrast  03/03/2015  CLINICAL DATA:  Witnessed general seizure at home today. No history of seizures. EXAM: MRI HEAD WITHOUT AND WITH CONTRAST MRI CERVICAL SPINE WITHOUT AND WITH CONTRAST TECHNIQUE: Multiplanar, multiecho pulse sequences of the brain and surrounding structures, and cervical spine, to include the craniocervical junction and  cervicothoracic junction, were obtained without and with intravenous contrast. 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 Cerebellar tonsillars descend 17 mm below the foramen magnum with pointed appearance and effacement of cerebral spinal fluid space. No Dustin findings of intracranial hypotension. The ventricles and sulci are normal for patient's age. No abnormal parenchymal signal, mass lesions, mass effect. No reduced diffusion to suggest acute ischemia. A few subcentimeter foci of FLAIR T2 hyperintense signal are less than expected for age, most commonly seen with chronic small vessel ischemic disease. No susceptibility artifact to suggest hemorrhage. No abnormal intracranial enhancement. Hippocampi demonstrate symmetric size, morphology and signal. No abnormal extra-axial fluid collections. No extra-axial masses though, contrast enhanced  sequences would be more sensitive. Normal major intracranial vascular flow voids seen at the skull base. Ocular globes and orbital contents are unremarkable though not tailored for evaluation. No abnormal sellar expansion. No suspicious calvarial bone marrow signal. Mild dolichocephaly. Small LEFT maxillary mucosal retention cyst without paranasal sinus air-fluid levels. Mastoid air cells are well aerated. MRI CERVICAL SPINE FINDINGS Prominent body habitus results in decreased signal to noise ratio. Cervical vertebral bodies are intact and aligned and maintenance of cervical lordosis. Intervertebral discs demonstrate normal morphology and signal characteristics. No STIR signal abnormality to suggest acute osseous process. No abnormal osseous or intradiscal enhancement. Prominent central spinal canal is less than 2 mm. No abnormal cord signal or myelomalacia. No abnormal cord, leptomeningeal or epidural enhancement. Included prevertebral  and paraspinal soft tissues are normal. Level by level evaluation: C2-3: No disc bulge, canal stenosis nor neural foraminal narrowing. C3-4: Small broad-based disc bulge, uncovertebral hypertrophy. No canal stenosis. Mild RIGHT, moderate LEFT neural foraminal narrowing. C4-5: No disc bulge. Mild facet arthropathy and uncovertebral hypertrophy without canal stenosis. Mild to moderate RIGHT neural foraminal narrowing. C5-6: No disc bulge. Moderate RIGHT, mild LEFT facet arthropathy and uncovertebral hypertrophy without canal stenosis. Mild bilateral neural foraminal narrowing. C6-7 and C7-T1: No disc bulge, canal stenosis or neural foraminal narrowing. IMPRESSION: MRI BRAIN:  No acute intracranial process. Platybasia and Chiari 1 malformation. MRI CERVICAL SPINE:  Prominent central spinal canal, without syrinx. Degenerative change of the cervical spine without canal stenosis. C3-4 through C5-6 neural foraminal narrowing: Moderate on the LEFT at C3-4. Electronically Signed   By: Awilda Metro M.D.   On: 03/03/2015 00:16   Dustin Cervical Spine W Wo Contrast  03/03/2015  CLINICAL DATA:  Witnessed general seizure at home today. No history of seizures. EXAM: MRI HEAD WITHOUT AND WITH CONTRAST MRI CERVICAL SPINE WITHOUT AND WITH CONTRAST TECHNIQUE: Multiplanar, multiecho pulse sequences of the brain and surrounding structures, and cervical spine, to include the craniocervical junction and cervicothoracic junction, were obtained without and with intravenous contrast. 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 Cerebellar tonsillars descend 17 mm below the foramen magnum with pointed appearance and effacement of cerebral spinal fluid space. No Dustin findings of intracranial hypotension. The ventricles and sulci are normal for patient's age. No abnormal parenchymal signal, mass  lesions, mass effect. No reduced diffusion to suggest acute ischemia. A few subcentimeter foci of FLAIR T2 hyperintense signal are less than expected for age, most commonly seen with chronic small vessel ischemic disease. No susceptibility artifact to suggest hemorrhage. No abnormal intracranial enhancement. Hippocampi demonstrate symmetric size, morphology and signal. No abnormal extra-axial fluid collections. No extra-axial masses though, contrast enhanced sequences would be more sensitive. Normal major intracranial vascular flow voids seen at the skull base. Ocular globes and orbital contents are unremarkable though not tailored for evaluation. No abnormal sellar expansion. No suspicious calvarial bone marrow signal. Mild dolichocephaly. Small LEFT maxillary mucosal retention cyst without paranasal sinus air-fluid levels. Mastoid air cells are well aerated. MRI CERVICAL SPINE FINDINGS Prominent body habitus results in decreased signal to noise ratio. Cervical vertebral bodies are intact and aligned and maintenance of cervical lordosis. Intervertebral discs demonstrate normal morphology and signal characteristics. No STIR signal abnormality to suggest acute osseous process. No abnormal osseous or intradiscal enhancement. Prominent central spinal canal is less than 2 mm. No abnormal cord signal or myelomalacia. No abnormal cord,  leptomeningeal or epidural enhancement. Included prevertebral and paraspinal soft tissues are normal. Level by level evaluation: C2-3: No disc bulge, canal stenosis nor neural foraminal narrowing. C3-4: Small broad-based disc bulge, uncovertebral hypertrophy. No canal stenosis. Mild RIGHT, moderate LEFT neural foraminal narrowing. C4-5: No disc bulge. Mild facet arthropathy and uncovertebral hypertrophy without canal stenosis. Mild to moderate RIGHT neural foraminal narrowing. C5-6: No disc bulge. Moderate RIGHT, mild LEFT facet arthropathy and uncovertebral hypertrophy without canal  stenosis. Mild bilateral neural foraminal narrowing. C6-7 and C7-T1: No disc bulge, canal stenosis or neural foraminal narrowing. IMPRESSION: MRI BRAIN:  No acute intracranial process. Platybasia and Chiari 1 malformation. MRI CERVICAL SPINE:  Prominent central spinal canal, without syrinx. Degenerative change of the cervical spine without canal stenosis. C3-4 through C5-6 neural foraminal narrowing: Moderate on the LEFT at C3-4. Electronically Signed   By: Awilda Metro M.D.   On: 03/03/2015 00:16    Microbiology: No results found for this or any previous visit (from the past 240 hour(s)).   Labs: Basic Metabolic Panel:  Recent Labs Lab 03/02/15 1750 03/03/15 0258  NA 140 138  K 4.8 4.4  CL 106 105  CO2 17* 22  GLUCOSE 158* 146*  BUN 14 11  CREATININE 1.61* 1.23  CALCIUM 8.9 8.9  MG 2.3  --    Liver Function Tests:  Recent Labs Lab 03/02/15 1750  AST 39  ALT 23  ALKPHOS 77  BILITOT 0.8  PROT 6.5  ALBUMIN 3.6   No results for input(s): LIPASE, AMYLASE in the last 168 hours. No results for input(s): AMMONIA in the last 168 hours. CBC:  Recent Labs Lab 03/02/15 1750 03/03/15 0258  WBC 5.1 6.5  HGB 13.7 13.2  HCT 41.7 39.4  MCV 80.8 79.3  PLT 204 176   Cardiac Enzymes: No results for input(s): CKTOTAL, CKMB, CKMBINDEX, TROPONINI in the last 168 hours. BNP: BNP (last 3 results) No results for input(s): BNP in the last 8760 hours.  ProBNP (last 3 results) No results for input(s): PROBNP in the last 8760 hours.  CBG:  Recent Labs Lab 03/02/15 1745  GLUCAP 162*       Signed:  Jeralyn Bennett MD.  [facility] 03/04/2015, 12:59 PM

## 2015-03-04 NOTE — Progress Notes (Signed)
Pt discharge instructions complete with family at bedside.  Pt denies any further questions, comments or concerns.  Emotional support given; pt wheeled out by tech.

## 2015-03-04 NOTE — Procedures (Signed)
HPI:  -Mr. Burel presents after having a seizure at home. He does not have a history of seizures. Workup has included on MRI of brain that was negative for intracranial pathology. Lab work reviewed he does not have electrolyte abnormalities, renal failure, elevated white count or any indication of underlying infectious process namely CNS infection.  TECHNICAL SUMMARY:  A multichannel referential and bipolar montage EEG using the standard international 10-20 system was performed on the patient described as awake, drowsy, and asleep.  The dominant background activity consists of 8-9 hertz activity seen most prominantly over the posterior head region.  The backgound activity is reactive to eye opening and closing procedures.  Low voltage fast (beta) activity is distributed symmetrically and maximally over the anterior head regions.  ACTIVATION:  Stepwise photic stimulation at 4-20 flashes per second was performed and did not elicit any abnormal waveforms.  Hyperventilation was performed for 3 minutes with good patient effort and produced no changes in the background activity.  EPILEPTIFORM ACTIVITY:  There were no spikes, sharp waves or paroxysmal activity.  SLEEP:  Physiologic drowsiness and stage II sleep were noted  CARDIAC:  The EKG lead revealed a regular sinus rhythm.  IMPRESSION:  This is a normal EEG for the patients stated age.  There were no focal, hemispheric or lateralizing features.  No epileptiform activity was recorded.  A normal EEG does not exclude the diagnosis of a seizure disorder and if seizure remains high on the list of differential diagnosis, an ambulatory EEG may be of value.  Clinical correlation is required.

## 2016-10-26 IMAGING — MR MR HEAD WO/W CM
11 of 22 series · 26 of 48 positions shown · IV contrast (Yes   MULTIHANCE)
Comparison: CT head March 02, 2015 at 2627 hours

CLINICAL DATA: Witnessed general seizure at home today. No history
of seizures.

EXAM:
MRI HEAD WITHOUT AND WITH CONTRAST
MRI CERVICAL SPINE WITHOUT AND WITH CONTRAST
TECHNIQUE: Multiplanar, multiecho pulse sequences of the brain and surrounding
structures, and cervical spine, to include the craniocervical
junction and cervicothoracic junction, were obtained without and
with intravenous contrast.
CONTRAST:  18mL MULTIHANCE GADOBENATE DIMEGLUMINE 529 MG/ML IV SOLN

[Series 4: DWI · axial · 3.0mm · 1.09mm/px · z∈[-90,+64]mm · 5 of 106 slices shown (1 of 4)]
[im 1/106]
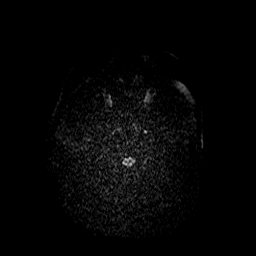
[im 27/106]
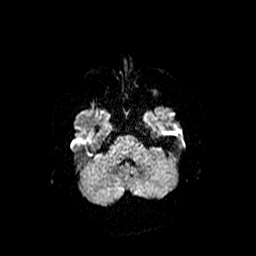
[im 53/106]
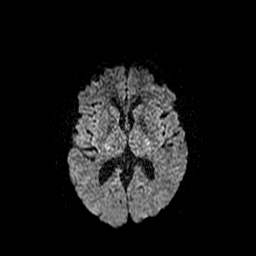
[im 79/106]
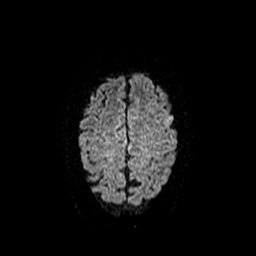
[im 106/106]
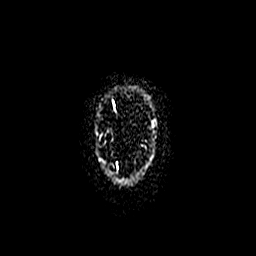

[Series 5: DWI · coronal · 5.0mm · 1.09mm/px · 3 of 70 slices shown (2 of 4)]
[im 1/70]
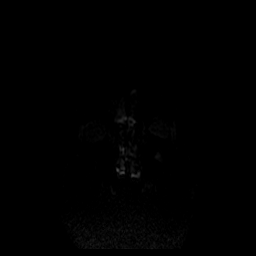
[im 35/70]
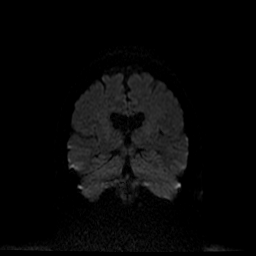
[im 70/70]
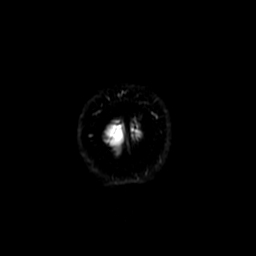

[Series 6: T2 · axial · 5.0mm · 0.45mm/px · z∈[-93,+61]mm · 2 of 27 slices shown (1 of 3)]
[im 1/27]
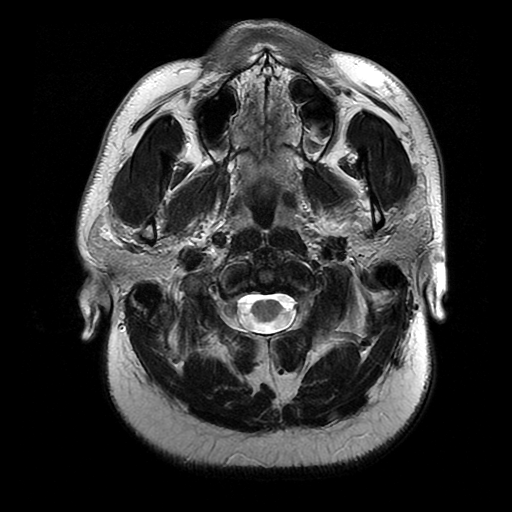
[im 27/27]
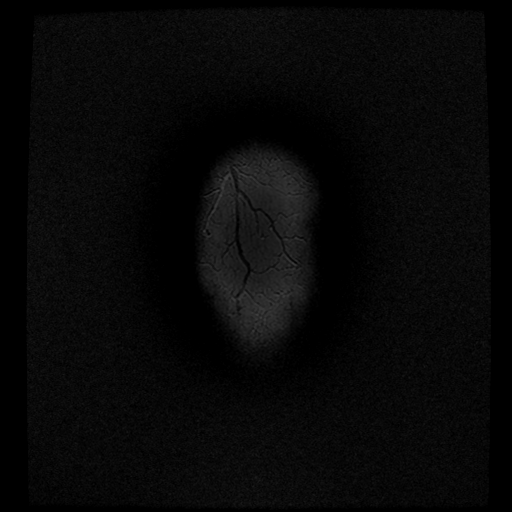

[Series 7: FLAIR · axial · 5.0mm · 0.45mm/px · z∈[-93,+61]mm · 2 of 27 slices shown]
[im 1/27]
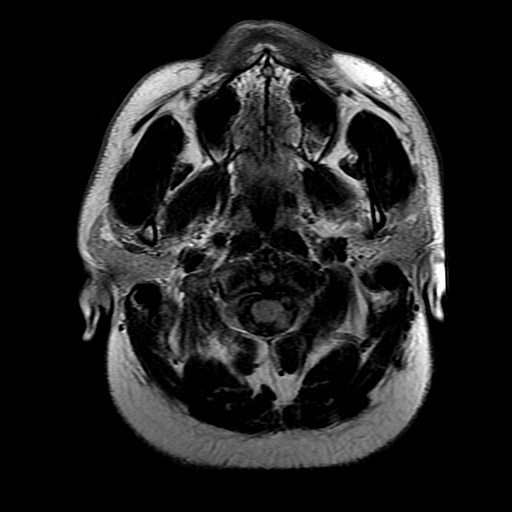
[im 27/27]
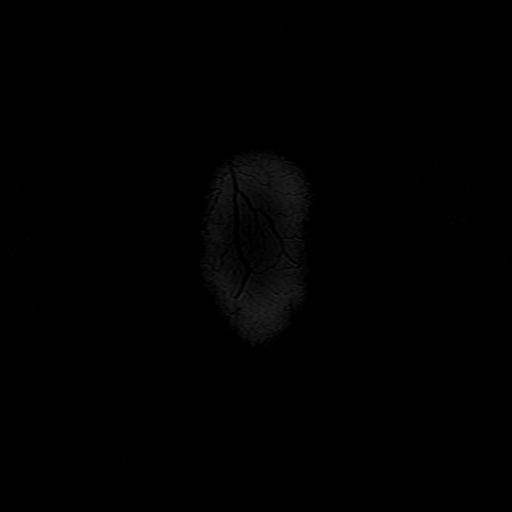

[Series 10: T2 · coronal · 5.0mm · 0.39mm/px · 2 of 27 slices shown (2 of 3)]
[im 1/27]
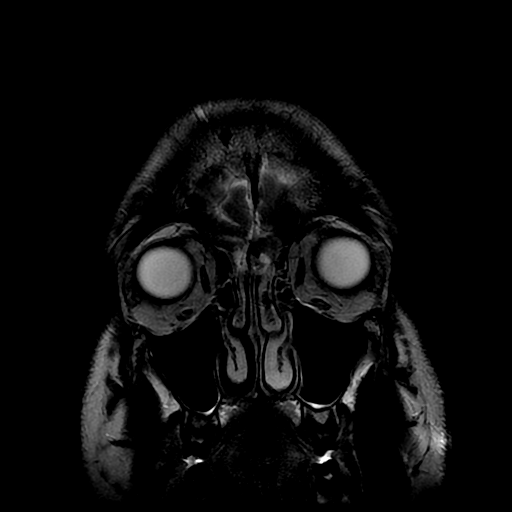
[im 27/27]
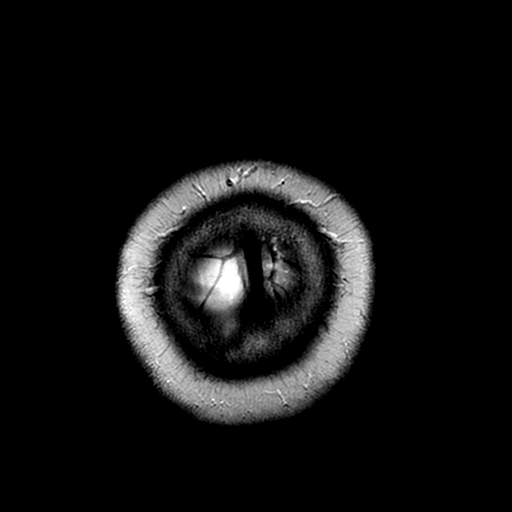

[Series 11: T2 · coronal · 3.0mm · 0.35mm/px · 2 of 31 slices shown (3 of 3)]
[im 1/31]
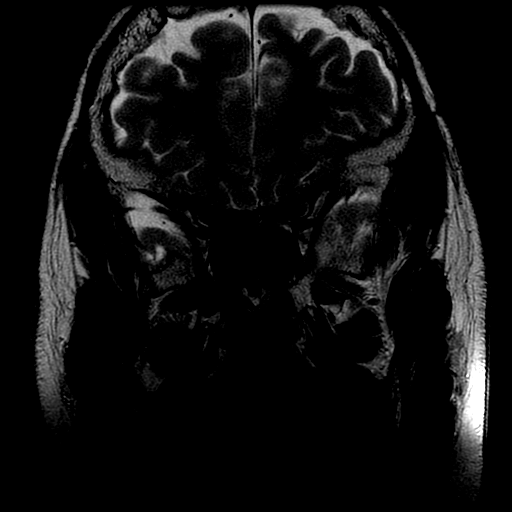
[im 31/31]
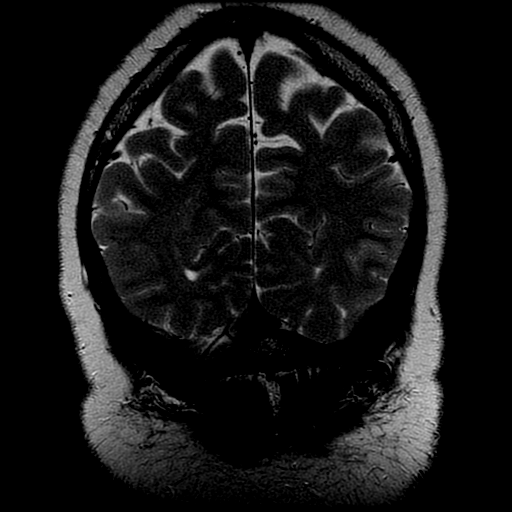

[Series 20: T1 post-contrast · axial · 3.0mm · 0.39mm/px · z∈[-261,-139]mm · 2 of 37 slices shown (1 of 3)]
[im 1/37]
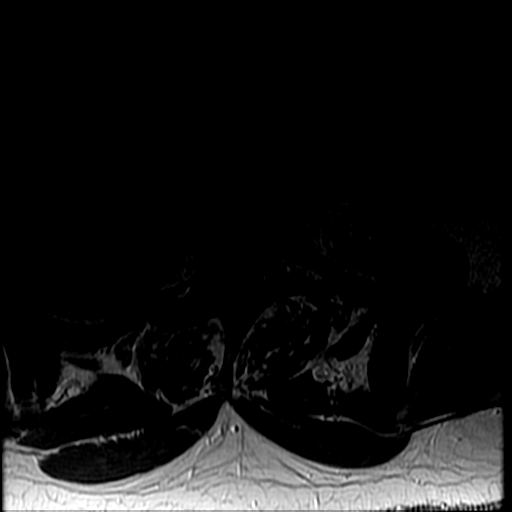
[im 37/37]
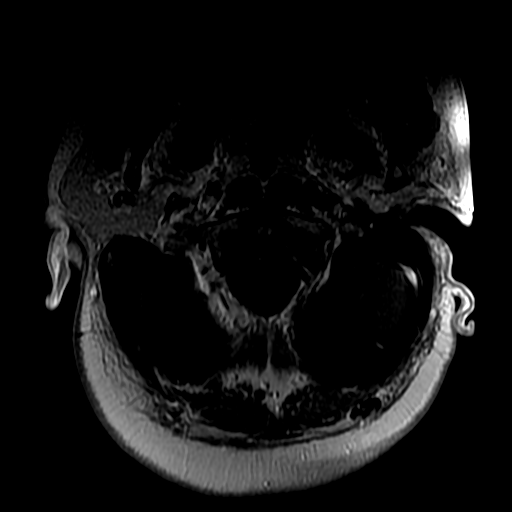

[Series 22: T1 post-contrast · coronal · 5.0mm · 0.39mm/px · 2 of 27 slices shown (2 of 3)]
[im 1/27]
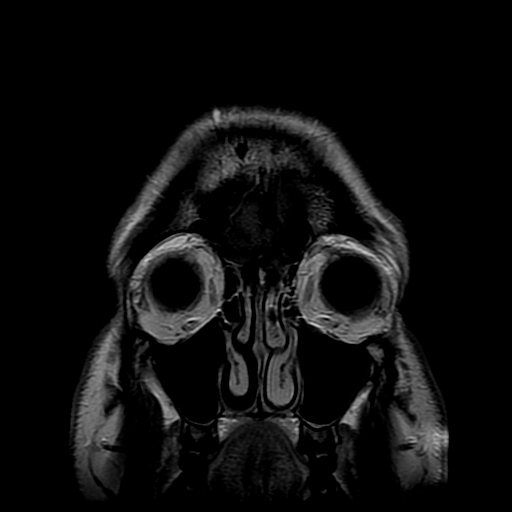
[im 27/27]
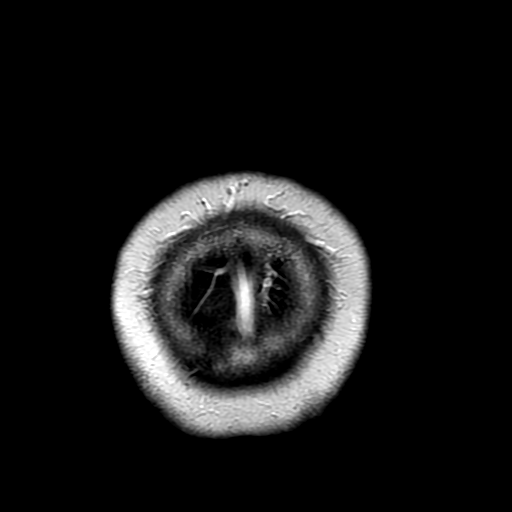

[Series 23: T1 post-contrast · sagittal · 5.0mm · 0.47mm/px · 1 of 25 slices shown (3 of 3)]
[im 1/25]
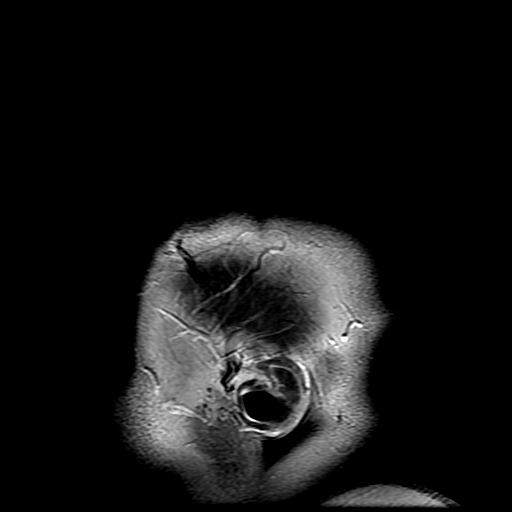

[Series 400: DWI · axial · 3.0mm · 1.09mm/px · z∈[-90,+64]mm · 3 of 53 slices shown (3 of 4)]
[im 1/53]
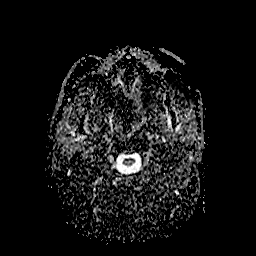
[im 27/53]
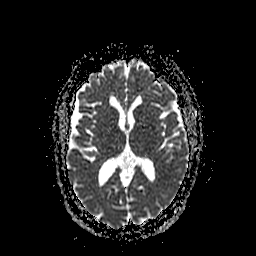
[im 53/53]
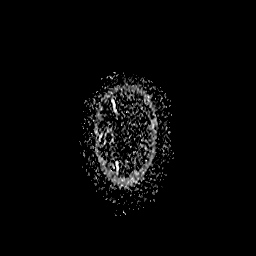

[Series 500: DWI · coronal · 5.0mm · 1.09mm/px · 2 of 35 slices shown (4 of 4)]
[im 1/35]
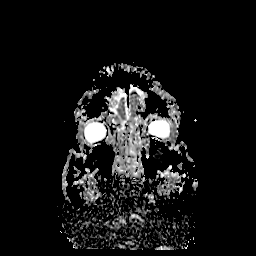
[im 35/35]
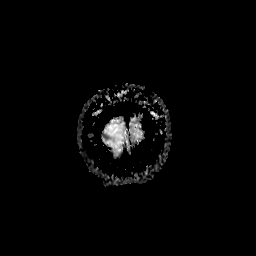

[26 of 48 positions shown; findings below may reference images not displayed]

FINDINGS: MRI HEAD FINDINGS

Platybasia without basilar invagination. Cerebellar tonsillars
descend 17 mm below the foramen magnum with pointed appearance and
effacement of cerebral spinal fluid space. No MR findings of
intracranial hypotension.

The ventricles and sulci are normal for patient's age. No abnormal
parenchymal signal, mass lesions, mass effect. No reduced diffusion
to suggest acute ischemia. A few subcentimeter foci of FLAIR T2
hyperintense signal are less than expected for age, most commonly
seen with chronic small vessel ischemic disease. No susceptibility
artifact to suggest hemorrhage. No abnormal intracranial
enhancement. Hippocampi demonstrate symmetric size, morphology and
signal.

No abnormal extra-axial fluid collections. No extra-axial masses
though, contrast enhanced sequences would be more sensitive. Normal
major intracranial vascular flow voids seen at the skull base.

Ocular globes and orbital contents are unremarkable though not
tailored for evaluation. No abnormal sellar expansion. No suspicious
calvarial bone marrow signal. Mild dolichocephaly. Small LEFT
maxillary mucosal retention cyst without paranasal sinus air-fluid
levels. Mastoid air cells are well aerated.

MRI CERVICAL SPINE FINDINGS

Prominent body habitus results [REDACTED]reased signal to noise ratio.
Cervical vertebral bodies are intact and aligned and maintenance of
cervical lordosis. Intervertebral discs demonstrate normal
morphology and signal characteristics. No STIR signal abnormality to
suggest acute osseous process. No abnormal osseous or intradiscal
enhancement.

Prominent central spinal canal is less than 2 mm. No abnormal cord
signal or myelomalacia. No abnormal cord, leptomeningeal or epidural
enhancement. Included prevertebral and paraspinal soft tissues are
normal.

Level by level evaluation:

C2-3: No disc bulge, canal stenosis nor neural foraminal narrowing.

C3-4: Small broad-based disc bulge, uncovertebral hypertrophy. No
canal stenosis. Mild RIGHT, moderate LEFT neural foraminal
narrowing.

C4-5: No disc bulge. Mild facet arthropathy and uncovertebral
hypertrophy without canal stenosis. Mild to moderate RIGHT neural
foraminal narrowing.

C5-6: No disc bulge. Moderate RIGHT, mild LEFT facet arthropathy and
uncovertebral hypertrophy without canal stenosis. Mild bilateral
neural foraminal narrowing.

C6-7 and C7-T1: No disc bulge, canal stenosis or neural foraminal
narrowing.
IMPRESSION: MRI BRAIN:  No acute intracranial process.

Platybasia and Chiari 1 malformation.

MRI CERVICAL SPINE:  Prominent central spinal canal, without syrinx.

Degenerative change of the cervical spine without canal stenosis.
C3-4 through C5-6 neural foraminal narrowing: Moderate on the LEFT
at C3-4.
